# Patient Record
Sex: Male | Born: 1970 | Hispanic: Yes | Marital: Married | Smoking: Never smoker
Health system: Southern US, Community
[De-identification: ages and names within clinical notes are randomized; demographics above are authoritative.]

---

## 2016-04-21 ENCOUNTER — Emergency Department (HOSPITAL_COMMUNITY)
Admission: EM | Admit: 2016-04-21 | Discharge: 2016-04-21 | Disposition: A | Discharge status: Home / Self Care | Payer: Worker's Compensation | Attending: Emergency Medicine | Admitting: Emergency Medicine

## 2016-04-21 ENCOUNTER — Encounter (HOSPITAL_COMMUNITY): Payer: Self-pay

## 2016-04-21 ENCOUNTER — Emergency Department (HOSPITAL_COMMUNITY): Payer: Worker's Compensation

## 2016-04-21 DIAGNOSIS — S6991XA Unspecified injury of right wrist, hand and finger(s), initial encounter: Secondary | ICD-10-CM | POA: Diagnosis present

## 2016-04-21 DIAGNOSIS — S61212A Laceration without foreign body of right middle finger without damage to nail, initial encounter: Secondary | ICD-10-CM

## 2016-04-21 DIAGNOSIS — Y9289 Other specified places as the place of occurrence of the external cause: Secondary | ICD-10-CM | POA: Diagnosis not present

## 2016-04-21 DIAGNOSIS — Y9389 Activity, other specified: Secondary | ICD-10-CM | POA: Diagnosis not present

## 2016-04-21 DIAGNOSIS — Y998 Other external cause status: Secondary | ICD-10-CM | POA: Insufficient documentation

## 2016-04-21 DIAGNOSIS — W312XXA Contact with powered woodworking and forming machines, initial encounter: Secondary | ICD-10-CM | POA: Diagnosis not present

## 2016-04-21 DIAGNOSIS — Z23 Encounter for immunization: Secondary | ICD-10-CM | POA: Insufficient documentation

## 2016-04-21 DIAGNOSIS — S61214A Laceration without foreign body of right ring finger without damage to nail, initial encounter: Secondary | ICD-10-CM | POA: Insufficient documentation

## 2016-04-21 MED ORDER — TETANUS-DIPHTH-ACELL PERTUSSIS 5-2.5-18.5 LF-MCG/0.5 IM SUSP
0.5000 mL | Freq: Once | INTRAMUSCULAR | Status: AC
Start: 2016-04-21 — End: 2016-04-21
  Administered 2016-04-21: 0.5 mL via INTRAMUSCULAR
  Filled 2016-04-21: qty 0.5

## 2016-04-21 MED ORDER — CEPHALEXIN 500 MG PO CAPS: 500.0000 mg | ORAL_CAPSULE | Freq: Three times a day (TID) | ORAL | Status: AC

## 2016-04-21 MED ORDER — NAPROXEN 500 MG PO TABS: 500.0000 mg | ORAL_TABLET | Freq: Two times a day (BID) | ORAL | Status: AC

## 2016-04-21 MED ORDER — HYDROCODONE-ACETAMINOPHEN 5-325 MG PO TABS
1.0000 | ORAL_TABLET | Freq: Once | ORAL | Status: AC
  Administered 2016-04-21: 1 via ORAL
  Filled 2016-04-21: qty 1

## 2016-04-21 MED ORDER — LIDOCAINE HCL 2 % IJ SOLN
10.0000 mL | Freq: Once | INTRAMUSCULAR | Status: AC
  Administered 2016-04-21: 200 mg
  Filled 2016-04-21: qty 20

## 2016-04-21 MED ORDER — HYDROCODONE-ACETAMINOPHEN 5-325 MG PO TABS: 1.0000 | ORAL_TABLET | ORAL | Status: AC | PRN

## 2016-04-21 MED ORDER — CEPHALEXIN 250 MG PO CAPS
500.0000 mg | ORAL_CAPSULE | Freq: Once | ORAL | Status: AC
  Administered 2016-04-21: 500 mg via ORAL
  Filled 2016-04-21: qty 2

## 2016-04-21 NOTE — ED Provider Notes (Signed)
CSN: [id number]     Arrival date & time 04/21/16  1746 History  By signing my name below, I, Jacob Carson, attest that this documentation has been prepared under the direction and in the presence of Jacob Carson Y Jacob Carson, [place]. Electronically Signed: Placido Carson, ED Scribe. 04/21/2016. 7:31 PM.   Chief Complaint  Patient presents with  . Extremity Laceration   The history is provided by the patient. No language interpreter was used.    HPI Comments: Jacob Carson is a 45 y.o. male who presents to the Emergency Department complaining of a moderate laceration with controlled bleeding to the padding of the 3rd digit of his right hand which occurred PTA. Pt was using a saw which kicked back and struck the affected finger. He reports associated, moderate, pain surrounding the wound that worsens with palpation. He is not on any blood thinning medications. Pt is unsure of the timing of his last tetanus vaccination. He denies numbness, tingling or any other associated symptoms at this time.   No past medical history on file. No past surgical history on file. No family history on file. Social History  Substance Use Topics  . Smoking status: Not on file  . Smokeless tobacco: Not on file  . Alcohol Use: Not on file    Review of Systems A complete 10 system review of systems was obtained and all systems are negative except as noted in the HPI and PMH.   Allergies  Review of patient's allergies indicates not on file.  Home Medications   Prior to Admission medications   Not on File   BP 132/97 mmHg  Pulse 99  Temp(Src) 98.8 F (37.1 C) (Oral)  Resp 16  SpO2 96%   Physical Exam  Constitutional: He is oriented to person, place, and time. He appears well-developed and well-nourished.  HENT:  Head: Normocephalic and atraumatic.  Eyes: EOM are normal.  Neck: Normal range of motion.  Cardiovascular: Normal rate.   Pulmonary/Chest: Effort normal. No respiratory distress.  Abdominal: Soft.   Musculoskeletal: Normal range of motion.  Right middle finger volar, distal aspect with macerated tissue, shredded skin. Nail appears secure and intact. Bleeding well controlled. No foreign bodies or bone visualized. FROM of finger.  Right fourth finger with superficial laceration to radial aspect of middle phalanx. No bleeding or foreign bodies.  Neurological: He is alert and oriented to person, place, and time.  Skin: Skin is warm and dry.  Psychiatric: He has a normal mood and affect.  Nursing note and vitals reviewed.  ED Course  Procedures   NERVE BLOCK Performed by: Noelle Penner Consent: Verbal consent obtained. Required items: required blood products, implants, devices, and special equipment available Time out: Immediately prior to procedure a "time out" was called to verify the correct patient, procedure, equipment, support staff and site/side marked as required.  Indication: laceration of right middle finger Nerve block body site: right middle finger  Preparation: Patient was prepped and draped in the usual sterile fashion. Needle gauge: 24 G Location technique: anatomical landmarks  Local anesthetic: 2% lidocaine without epi  Anesthetic total: 5 ml  Outcome: pain improved Patient tolerance: Patient tolerated the procedure well with no immediate complications.   DIAGNOSTIC STUDIES: Oxygen Saturation is 96% on RA, normal by my interpretation.    COORDINATION OF CARE: 6:04 PM Discussed next steps with pt. He verbalized understanding and is agreeable with the plan.   Labs Review Labs Reviewed - No data to display  Imaging Review Dg  Finger Middle Right  04/21/2016  CLINICAL DATA:  Saw cut distal in of right middle finger today, pain right middle finger. EXAM: RIGHT MIDDLE FINGER 2+V COMPARISON:  None. FINDINGS: Soft tissue irregularity overlying the distal tuft of the right third digit, compatible with given history of laceration. No underlying osseous abnormality.  Underlying cortex of the distal phalanx appears intact throughout. No osseous fracture or dislocation. IMPRESSION: Soft tissue laceration.  No osseous fracture or dislocation. Electronically Signed   By: Bary RichardStan  Maynard M.D.   On: 04/21/2016 19:19   I have personally reviewed and evaluated these images as part of my medical decision-making.   EKG Interpretation None      MDM   Final diagnoses:  Laceration of right middle finger w/o foreign body w/o damage to nail, initial encounter    X-ray reveals no evidence of fracture. Tissue of finger is macerated and skin is shredded, with no viable way of suture closure. Pt was given nerve block for pain control and thorough irrigation and debridement of area. Finger was dressed with bacitracin and dressing. Instructed close f/u with hand. i did discuss with pt that ultimately that area of tissue might need to be surgically removed. tdap updated. rx given for abx and pain meds. ER return precautions given.   I personally performed the services described in this documentation, which was scribed in my presence. The recorded information has been reviewed and is accurate.   Carlene CoriaSerena Y Yechezkel Fertig, PA-C 04/21/16 2154

## 2016-04-21 NOTE — Discharge Instructions (Signed)
Please call Dr. Glenna Durandrtmann's office to schedule a follow up appointment as soon as possible. In the meantime please take antibiotics as prescribed. I also gave you a couple prescriptions to help with your pain. Return to the emergency room for new or worsening symptoms.   Cuidado de Patent examinerun desgarro no suturado (Nonsutured Laceration Care) Un desgarro es un corte que atraviesa todas las capas de la piel y llega al tejido que se encuentra debajo de la piel. Por lo general, este tipo de corte se cose (sutura) o se cierra con cinta (tiras [phone]) o pegamento para la piel inmediatamente despus de la lesin. Sin embargo, si la herida est sucia o si pasan varias horas antes de recibir tratamiento mdico, es probable que entren microbios (bacterias) en la herida. El cierre de un desgarro despus de que han entrado bacterias aumenta el riesgo de infeccin. En Franklin Resourcesestos casos, el mdico puede dejar el desgarro abierto (no suturado) y cubrirlo con una venda. Este tipo de tratamiento ayuda a prevenir la infeccin y permite que la herida cicatrice desde la capa ms profunda del tejido daado hasta la superficie. La fractura abierta es un tipo de lesin que puede relacionarse con los desgarros no suturados. La fractura abierta es la quebradura de un hueso que se produce con uno o ms desgarros en la piel cercana al sitio de Printmakerla fractura. CMO CUIDAR DEL DESGARRO NO SUTURADO  Tome o aplquese los medicamentos de venta libre y recetados solamente como se lo haya indicado el mdico.  Si le recetaron un antibitico, tmelo o aplqueselo como se lo haya indicado el mdico. No deje de usar el antibitico aunque la afeccin mejore.  Limpie la herida una vez al da o como se lo haya indicado el mdico.  Lave la herida con agua y [place] suave.  Enjuguela con agua para quitar todo el [place].  Seque la herida dando palmaditas con una toalla limpia. No frote la herida.  No inyecte nada en la herida a menos que se lo haya  indicado el mdico.  Cambie las vendas (vendajes) como se lo haya indicado el mdico. Esto incluye el cambio de los vendajes si se mojan, se ensucian o tienen mal olor.  Mantenga el vendaje seco hasta que el mdico le diga que se lo puede quitar. No tome baos de inmersin, no nade ni realice ninguna actividad que haga que la herida quede debajo del agua, hasta que el mdico lo autorice.  Cuando est sentado o acostado, eleve la zona de la lesin por encima del nivel del corazn, si es posible.  No se rasque ni se toque la herida.  Controle la herida CarMaxtodos los das para detectar signos de infeccin. Est atento a lo siguiente:  Dolor, hinchazn o enrojecimiento.  Lquido, sangre o pus.  Concurra a todas las visitas de control como se lo haya indicado el mdico. Esto es importante. SOLICITE ATENCIN MDICA SI:  Le aplicaron la antitetnica y tiene hinchazn, dolor intenso, enrojecimiento o hemorragia en el lugar de la inyeccin.   Tiene fiebre.  El dolor no se alivia con los United Parcelmedicamentos.  Tiene ms enrojecimiento, hinchazn o dolor en el lugar de la herida.  Observa lquido, sangre o pus que salen de la herida.  Percibe que sale mal olor de la herida o del vendaje.  Nota un cuerpo extrao en la herida, como un trozo de [place] o vidrio.  Observa que la piel cerca de la herida cambia de color.  Aparece una nueva erupcin cutnea.  Debe cambiar el vendaje con frecuencia debido a que hay secrecin de lquido, sangre o pus de la herida.  Tiene entumecimiento alrededor de la herida. SOLICITE ATENCIN MDICA DE INMEDIATO SI:  El dolor aumenta repentinamente y es intenso.  Tiene mucha hinchazn alrededor de la herida.  La herida est en la mano o en el pie y no puede mover correctamente uno de los dedos.  La herida est en la mano o en el pie y [employer] dedos tienen un tono plido o [place].  Tiene una lnea roja que sale de la herida.   Esta informacin no tiene  Theme park manager el consejo del mdico. Asegrese de hacerle al mdico cualquier pregunta que tenga.   Document Released: 03/29/2009 Document Revised: 04/27/2015 Elsevier Interactive Patient Education [employer]

## 2016-04-21 NOTE — ED Notes (Signed)
Pt. Cut his rt. Middle finger on a saw. Rt. Middle finger tip is almost off.  Bleeding controlled. Pt. Also has a laceration to his rt. 4th finger. Bleeding controlled.

## 2016-07-06 NOTE — ED Provider Notes (Signed)
Medical screening examination/treatment/procedure(s) were performed by non-physician practitioner and as supervising physician I was immediately available for consultation/collaboration.   EKG Interpretation None        Rogue Rafalski, MD 07/06/16 1251 

## 2017-08-26 IMAGING — CR DG FINGER MIDDLE 2+V*R*
3 series · 3 of 3 positions shown · non-contrast
Comparison: None.

CLINICAL DATA: Saw cut distal in of right middle finger today, pain
right middle finger.

EXAM:
RIGHT MIDDLE FINGER 2+V

[finger ap]
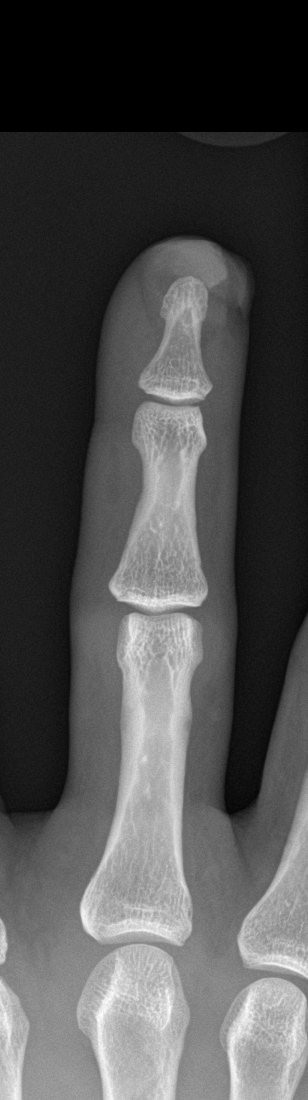

[finger obl]
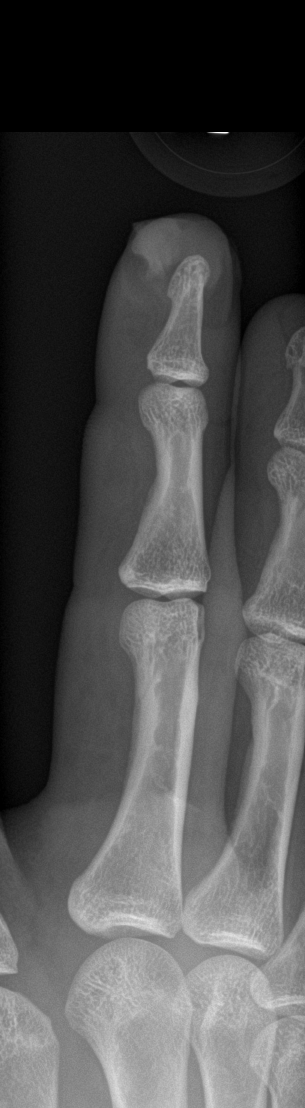

[finger lat]
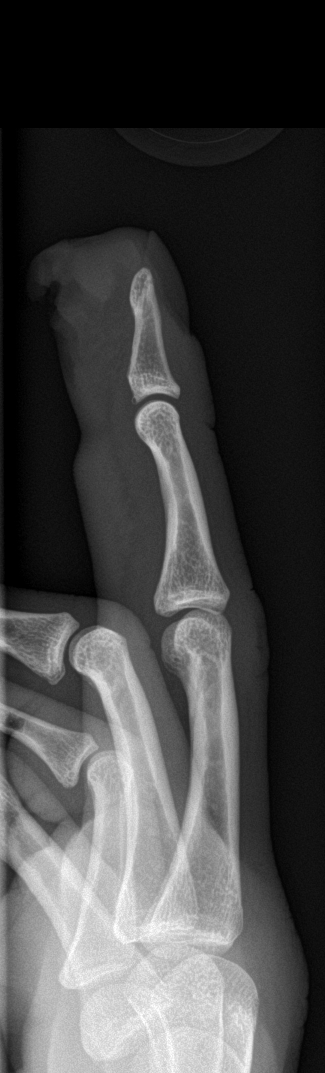

[3 of 3 positions shown; findings below may reference images not displayed]

FINDINGS: Soft tissue irregularity overlying the distal tuft of the right
third digit, compatible with given history of laceration. No
underlying osseous abnormality. Underlying cortex of the distal
phalanx appears intact throughout. No osseous fracture or
dislocation.
IMPRESSION: Soft tissue laceration.  No osseous fracture or dislocation.
